# Patient Record
Sex: Male | Born: 2000 | Race: Black or African American | Hispanic: No | Marital: Single | Smoking: Never smoker
Health system: Southern US, Community
[De-identification: ages and names within clinical notes are randomized; demographics above are authoritative.]

## PROBLEM LIST (undated history)

## (undated) DIAGNOSIS — S069XAA Unspecified intracranial injury with loss of consciousness status unknown, initial encounter: Secondary | ICD-10-CM

## (undated) DIAGNOSIS — R569 Unspecified convulsions: Secondary | ICD-10-CM

## (undated) DIAGNOSIS — T744XXA Shaken infant syndrome, initial encounter: Secondary | ICD-10-CM

---
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
## 2022-07-02 ENCOUNTER — Emergency Department (HOSPITAL_COMMUNITY)
Admission: EM | Admit: 2022-07-02 | Discharge: 2022-07-02 | Disposition: A | Discharge status: Home / Self Care | Payer: Medicaid Other | Attending: Emergency Medicine | Admitting: Emergency Medicine

## 2022-07-02 ENCOUNTER — Other Ambulatory Visit: Payer: Self-pay

## 2022-07-02 ENCOUNTER — Encounter (HOSPITAL_COMMUNITY): Payer: Self-pay

## 2022-07-02 DIAGNOSIS — R1110 Vomiting, unspecified: Secondary | ICD-10-CM | POA: Diagnosis not present

## 2022-07-02 DIAGNOSIS — E860 Dehydration: Secondary | ICD-10-CM | POA: Diagnosis not present

## 2022-07-02 DIAGNOSIS — R5383 Other fatigue: Secondary | ICD-10-CM | POA: Diagnosis present

## 2022-07-02 HISTORY — DX: Unspecified intracranial injury with loss of consciousness status unknown, initial encounter: S06.9XAA

## 2022-07-02 LAB — CBC WITH DIFFERENTIAL/PLATELET
Abs Immature Granulocytes: 0.01 10*3/uL (ref 0.00–0.07)
Basophils Absolute: 0 10*3/uL (ref 0.0–0.1)
Basophils Relative: 0 %
Eosinophils Absolute: 0 10*3/uL (ref 0.0–0.5)
Eosinophils Relative: 1 %
HCT: 43.5 % (ref 39.0–52.0)
Hemoglobin: 14.7 g/dL (ref 13.0–17.0)
Immature Granulocytes: 0 %
Lymphocytes Relative: 27 %
Lymphs Abs: 1.5 10*3/uL (ref 0.7–4.0)
MCH: 28.3 pg (ref 26.0–34.0)
MCHC: 33.8 g/dL (ref 30.0–36.0)
MCV: 83.7 fL (ref 80.0–100.0)
Monocytes Absolute: 0.4 10*3/uL (ref 0.1–1.0)
Monocytes Relative: 7 %
Neutro Abs: 3.7 10*3/uL (ref 1.7–7.7)
Neutrophils Relative %: 65 %
Platelets: 189 10*3/uL (ref 150–400)
RBC: 5.2 MIL/uL (ref 4.22–5.81)
RDW: 12.9 % (ref 11.5–15.5)
WBC: 5.7 10*3/uL (ref 4.0–10.5)
nRBC: 0 % (ref 0.0–0.2)

## 2022-07-02 LAB — URINALYSIS, ROUTINE W REFLEX MICROSCOPIC
Bilirubin Urine: NEGATIVE
Glucose, UA: NEGATIVE mg/dL
Hgb urine dipstick: NEGATIVE
Ketones, ur: 20 mg/dL — AB
Leukocytes,Ua: NEGATIVE
Nitrite: NEGATIVE
Protein, ur: NEGATIVE mg/dL
Specific Gravity, Urine: 1.003 — ABNORMAL LOW (ref 1.005–1.030)
pH: 6 (ref 5.0–8.0)

## 2022-07-02 LAB — COMPREHENSIVE METABOLIC PANEL
ALT: 13 U/L (ref 0–44)
AST: 15 U/L (ref 15–41)
Albumin: 4.4 g/dL (ref 3.5–5.0)
Alkaline Phosphatase: 45 U/L (ref 38–126)
Anion gap: 10 (ref 5–15)
BUN: 6 mg/dL (ref 6–20)
CO2: 29 mmol/L (ref 22–32)
Calcium: 8.9 mg/dL (ref 8.9–10.3)
Chloride: 100 mmol/L (ref 98–111)
Creatinine, Ser: 0.77 mg/dL (ref 0.61–1.24)
GFR, Estimated: >60 mL/min (ref >60)
Glucose, Bld: 97 mg/dL (ref 70–99)
Potassium: 3.2 mmol/L — ABNORMAL LOW (ref 3.5–5.1)
Sodium: 139 mmol/L (ref 135–145)
Total Bilirubin: 4 mg/dL — ABNORMAL HIGH (ref 0.3–1.2)
Total Protein: 7.5 g/dL (ref 6.5–8.1)

## 2022-07-02 LAB — LIPASE, BLOOD: Lipase: 29 U/L (ref 11–51)

## 2022-07-02 MED ORDER — SODIUM CHLORIDE 0.9 % IV BOLUS
1000.0000 mL | Freq: Once | INTRAVENOUS | Status: AC
  Administered 2022-07-02: 1000 mL via INTRAVENOUS

## 2022-07-02 MED ORDER — POTASSIUM CHLORIDE 10 MEQ/100ML IV SOLN
10.0000 meq | Freq: Once | INTRAVENOUS | Status: AC
  Administered 2022-07-02: 10 meq via INTRAVENOUS
  Filled 2022-07-02: qty 100

## 2022-07-02 MED ORDER — POTASSIUM CHLORIDE CRYS ER 20 MEQ PO TBCR: 20.0000 meq | EXTENDED_RELEASE_TABLET | Freq: Every day | ORAL | 0 refills | Status: AC

## 2022-07-02 NOTE — ED Notes (Signed)
pt aware about needing urine sample when first got here. Just went in to remind pt and he states he does not have to use it still.

## 2022-07-02 NOTE — ED Provider Notes (Signed)
[place] EMERGENCY DEPARTMENT AT [facility] Provider Note   CSN: [id number] Arrival date & time: 07/02/22  1416     History  Chief Complaint  Patient presents with   Dehydration         James Chung is a 22 y.o. male.  22 year old male from local group home. History of TBI. Group home staff indicate patient has not been eating or drinking much since joining the group home 2 months ago. Two episodes of vomiting today. Patient denies abdominal pain. Abdomen soft, non-tender to palpation. Lips are dry with peeling skin.  The history is provided by the patient and a caregiver. No language interpreter was used.       Home Medications Prior to Admission medications   Medication Sig Start Date End Date Taking? Authorizing Provider  methylphenidate (CONCERTA) 36 MG CR tablet Take 36 mg by mouth daily.    [provider]  polyethylene glycol (MIRALAX / GLYCOLAX) packet Take 17 g by mouth at bedtime.    [provider]      Allergies    Patient has no known allergies.    Review of Systems   Review of Systems  Constitutional:  Positive for appetite change. Negative for fever.  Gastrointestinal:  Positive for vomiting. Negative for abdominal pain.  All other systems reviewed and are negative.   Physical Exam Updated Vital Signs BP 115/86 (BP Location: Right Arm)   Pulse 76   Temp 98.2 F (36.8 C) (Oral)   Resp 16   Ht 6\' 2"  (1.88 m)   Wt 81.6 kg   SpO2 95%   BMI 23.11 kg/m  Physical Exam HENT:     Head: Normocephalic.     Nose: Nose normal.     Mouth/Throat:     Mouth: Mucous membranes are dry.  Eyes:     Conjunctiva/sclera: Conjunctivae normal.  Cardiovascular:     Rate and Rhythm: Normal rate.  Pulmonary:     Effort: Pulmonary effort is normal.  Abdominal:     General: There is no distension.     Palpations: Abdomen is soft.     Tenderness: There is no abdominal tenderness.  Musculoskeletal:        General: Normal range  of motion.  Skin:    General: Skin is warm and dry.  Neurological:     Mental Status: He is alert. Mental status is at baseline.  Psychiatric:        Mood and Affect: Mood normal.        Behavior: Behavior normal.     ED Results / Procedures / Treatments   Labs (all labs ordered are listed, but only abnormal results are displayed) Labs Reviewed  URINALYSIS, ROUTINE W REFLEX MICROSCOPIC - Abnormal; Notable for the following components:      Result Value   Color, Urine STRAW (*)    Specific Gravity, Urine 1.003 (*)    Ketones, ur 20 (*)    All other components within normal limits  COMPREHENSIVE METABOLIC PANEL - Abnormal; Notable for the following components:   Potassium 3.2 (*)    Total Bilirubin 4.0 (*)    All other components within normal limits  CBC WITH DIFFERENTIAL/PLATELET  LIPASE, BLOOD    EKG None  Radiology No results found.  Procedures Procedures    Medications Ordered in ED Medications  sodium chloride 0.9 % bolus 1,000 mL (0 mLs Intravenous Stopped 07/02/22 1844)  sodium chloride 0.9 % bolus 1,000 mL (  0 mLs Intravenous Stopped 07/02/22 2059)  potassium chloride 10 mEq in 100 mL IVPB (0 mEq Intravenous Stopped 07/02/22 2059)    ED Course/ Medical Decision Making/ A&P                             Medical Decision Making  Patient presents with malaise and fatigue likely secondary to dehydration. No indication of acute kidney injury. Considered alternate etiologies including infectious processes, severe metabolic derangement or electrolyte abnormalities, and intracranial/central processes but are unlikely given history, physical exam, and lab studies.  Noted to have mild hypokalemia. Supplementation in the ED.  Fluid bolus given. Patient is tolerating oral intake without difficulty.  Care instructions and return precautions provided. Will continue with potassium at home for three days.        Final Clinical Impression(s) / ED Diagnoses Final  diagnoses:  Dehydration    Rx / DC Orders ED Discharge Orders          Ordered    potassium chloride SA (KLOR-CON M) 20 MEQ tablet  Daily        07/02/22 2027              Felicie Morn, NP 07/02/22 2127    Wynetta Fines, MD 07/05/22 [phone]

## 2022-07-02 NOTE — ED Triage Notes (Signed)
Patient arrived POV with group home personnel. Patient brought in because he has not been eating or drinking much since transition into new group home 2 month ago. Hx of TBI. Also reports 2 episodes of vomiting today

## 2022-07-02 NOTE — Discharge Instructions (Addendum)
Please refer to the attached instructions. Take potassium supplement as directed.
